# Patient Record
Sex: Female | Born: 1965 | Race: Black or African American | Hispanic: No
Health system: Midwestern US, Community
[De-identification: ages and names within clinical notes are randomized; demographics above are authoritative.]

## PROBLEM LIST (undated history)

## (undated) HISTORY — PX: BREAST SURGERY: SHX581

## (undated) HISTORY — PX: AUGMENTATION MAMMAPLASTY: SUR837

---

## 2001-08-29 ENCOUNTER — Encounter: Payer: Self-pay | Admitting: Family Medicine

## 2001-08-29 ENCOUNTER — Encounter: Admission: RE | Admit: 2001-08-29 | Discharge: 2001-08-29 | Payer: Self-pay | Admitting: Family Medicine

## 2001-09-20 ENCOUNTER — Emergency Department (HOSPITAL_COMMUNITY): Admission: EM | Admit: 2001-09-20 | Discharge: 2001-09-20 | Payer: Self-pay | Admitting: Emergency Medicine

## 2002-11-23 ENCOUNTER — Ambulatory Visit (HOSPITAL_COMMUNITY): Admission: RE | Admit: 2002-11-23 | Discharge: 2002-11-23 | Payer: Self-pay | Admitting: Otolaryngology

## 2003-04-05 ENCOUNTER — Encounter: Payer: Self-pay | Admitting: Family Medicine

## 2003-04-05 ENCOUNTER — Encounter: Admission: RE | Admit: 2003-04-05 | Discharge: 2003-04-05 | Payer: Self-pay | Admitting: Family Medicine

## 2006-10-07 ENCOUNTER — Ambulatory Visit: Payer: Self-pay | Admitting: Internal Medicine

## 2007-03-02 ENCOUNTER — Ambulatory Visit: Payer: Self-pay | Admitting: Internal Medicine

## 2007-07-08 ENCOUNTER — Ambulatory Visit (HOSPITAL_COMMUNITY): Admission: RE | Admit: 2007-07-08 | Discharge: 2007-07-08 | Payer: Self-pay | Admitting: Obstetrics

## 2007-08-04 ENCOUNTER — Encounter: Admission: RE | Admit: 2007-08-04 | Discharge: 2007-08-04 | Payer: Self-pay | Admitting: Obstetrics

## 2007-11-15 ENCOUNTER — Other Ambulatory Visit: Admission: RE | Admit: 2007-11-15 | Discharge: 2007-11-15 | Payer: Self-pay | Admitting: Diagnostic Radiology

## 2007-11-15 ENCOUNTER — Encounter: Admission: RE | Admit: 2007-11-15 | Discharge: 2007-11-15 | Payer: Self-pay | Admitting: Obstetrics

## 2007-11-15 ENCOUNTER — Encounter (INDEPENDENT_AMBULATORY_CARE_PROVIDER_SITE_OTHER): Payer: Self-pay | Admitting: Diagnostic Radiology

## 2008-01-31 ENCOUNTER — Telehealth (INDEPENDENT_AMBULATORY_CARE_PROVIDER_SITE_OTHER): Payer: Self-pay | Admitting: Family Medicine

## 2008-02-02 ENCOUNTER — Ambulatory Visit: Payer: Self-pay | Admitting: Internal Medicine

## 2008-02-02 DIAGNOSIS — J209 Acute bronchitis, unspecified: Secondary | ICD-10-CM

## 2008-02-02 DIAGNOSIS — J9801 Acute bronchospasm: Secondary | ICD-10-CM

## 2009-05-12 ENCOUNTER — Emergency Department (HOSPITAL_BASED_OUTPATIENT_CLINIC_OR_DEPARTMENT_OTHER): Admission: EM | Admit: 2009-05-12 | Discharge: 2009-05-12 | Payer: Self-pay | Admitting: Emergency Medicine

## 2010-03-27 ENCOUNTER — Encounter: Admission: RE | Admit: 2010-03-27 | Discharge: 2010-03-27 | Payer: Self-pay | Admitting: Obstetrics

## 2010-05-16 ENCOUNTER — Encounter: Admission: RE | Admit: 2010-05-16 | Discharge: 2010-05-16 | Payer: Self-pay | Admitting: Obstetrics

## 2012-06-27 ENCOUNTER — Emergency Department (HOSPITAL_BASED_OUTPATIENT_CLINIC_OR_DEPARTMENT_OTHER)
Admission: EM | Admit: 2012-06-27 | Discharge: 2012-06-27 | Disposition: A | Discharge status: Home / Self Care | Payer: No Typology Code available for payment source | Attending: Emergency Medicine | Admitting: Emergency Medicine

## 2012-06-27 ENCOUNTER — Encounter (HOSPITAL_BASED_OUTPATIENT_CLINIC_OR_DEPARTMENT_OTHER): Payer: Self-pay | Admitting: *Deleted

## 2012-06-27 DIAGNOSIS — S335XXA Sprain of ligaments of lumbar spine, initial encounter: Secondary | ICD-10-CM | POA: Insufficient documentation

## 2012-06-27 DIAGNOSIS — S39012A Strain of muscle, fascia and tendon of lower back, initial encounter: Secondary | ICD-10-CM

## 2012-06-27 DIAGNOSIS — Y9389 Activity, other specified: Secondary | ICD-10-CM | POA: Insufficient documentation

## 2012-06-27 DIAGNOSIS — Y92410 Unspecified street and highway as the place of occurrence of the external cause: Secondary | ICD-10-CM | POA: Insufficient documentation

## 2012-06-27 MED ORDER — IBUPROFEN 800 MG PO TABS
800.0000 mg | ORAL_TABLET | Freq: Once | ORAL | Status: AC
  Administered 2012-06-27: 800 mg via ORAL
  Filled 2012-06-27: qty 1

## 2012-06-27 MED ORDER — IBUPROFEN 800 MG PO TABS: 800.0000 mg | ORAL_TABLET | Freq: Three times a day (TID) | ORAL | Status: DC | PRN

## 2012-06-27 MED ORDER — CYCLOBENZAPRINE HCL 10 MG PO TABS: 10.0000 mg | ORAL_TABLET | Freq: Three times a day (TID) | ORAL | Status: DC | PRN
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
## 2013-01-01 ENCOUNTER — Emergency Department (HOSPITAL_BASED_OUTPATIENT_CLINIC_OR_DEPARTMENT_OTHER)
Admission: EM | Admit: 2013-01-01 | Discharge: 2013-01-01 | Disposition: A | Discharge status: Home / Self Care | Payer: Self-pay | Attending: Emergency Medicine | Admitting: Emergency Medicine

## 2013-01-01 ENCOUNTER — Encounter (HOSPITAL_BASED_OUTPATIENT_CLINIC_OR_DEPARTMENT_OTHER): Payer: Self-pay

## 2013-01-01 DIAGNOSIS — K529 Noninfective gastroenteritis and colitis, unspecified: Secondary | ICD-10-CM

## 2013-01-01 DIAGNOSIS — R197 Diarrhea, unspecified: Secondary | ICD-10-CM | POA: Insufficient documentation

## 2013-01-01 DIAGNOSIS — R42 Dizziness and giddiness: Secondary | ICD-10-CM | POA: Insufficient documentation

## 2013-01-01 DIAGNOSIS — R109 Unspecified abdominal pain: Secondary | ICD-10-CM | POA: Insufficient documentation

## 2013-01-01 DIAGNOSIS — Z3202 Encounter for pregnancy test, result negative: Secondary | ICD-10-CM | POA: Insufficient documentation

## 2013-01-01 DIAGNOSIS — K5289 Other specified noninfective gastroenteritis and colitis: Secondary | ICD-10-CM | POA: Insufficient documentation

## 2013-01-01 DIAGNOSIS — R61 Generalized hyperhidrosis: Secondary | ICD-10-CM | POA: Insufficient documentation

## 2013-01-01 LAB — CBC WITH DIFFERENTIAL/PLATELET
Basophils Relative: 0 % (ref 0–1)
Eosinophils Absolute: 0 10*3/uL (ref 0.0–0.7)
HCT: 40.4 % (ref 36.0–46.0)
Hemoglobin: 13.5 g/dL (ref 12.0–15.0)
MCH: 30.1 pg (ref 26.0–34.0)
MCHC: 33.4 g/dL (ref 30.0–36.0)
MCV: 90.2 fL (ref 78.0–100.0)
Monocytes Absolute: 0.4 10*3/uL (ref 0.1–1.0)
Monocytes Relative: 4 % (ref 3–12)

## 2013-01-01 LAB — COMPREHENSIVE METABOLIC PANEL
Albumin: 4 g/dL (ref 3.5–5.2)
BUN: 13 mg/dL (ref 6–23)
Creatinine, Ser: 0.9 mg/dL (ref 0.50–1.10)
GFR calc Af Amer: 88 mL/min — ABNORMAL LOW (ref >90)
Total Bilirubin: 0.5 mg/dL (ref 0.3–1.2)
Total Protein: 8 g/dL (ref 6.0–8.3)

## 2013-01-01 LAB — URINALYSIS, ROUTINE W REFLEX MICROSCOPIC
Bilirubin Urine: NEGATIVE
Glucose, UA: NEGATIVE mg/dL
Hgb urine dipstick: NEGATIVE
Ketones, ur: 15 mg/dL — AB
Protein, ur: NEGATIVE mg/dL

## 2013-01-01 LAB — LIPASE, BLOOD: Lipase: 20 U/L (ref 11–59)

## 2013-01-01 MED ORDER — MECLIZINE HCL 25 MG PO TABS: 25.0000 mg | ORAL_TABLET | Freq: Three times a day (TID) | ORAL | Status: DC | PRN

## 2013-01-01 MED ORDER — ONDANSETRON HCL 4 MG/2ML IJ SOLN
4.0000 mg | Freq: Once | INTRAMUSCULAR | Status: AC
  Administered 2013-01-01: 4 mg via INTRAVENOUS
  Filled 2013-01-01: qty 2

## 2013-01-01 MED ORDER — PROMETHAZINE HCL 25 MG PO TABS: 25.0000 mg | ORAL_TABLET | Freq: Four times a day (QID) | ORAL | Status: DC | PRN

## 2013-01-01 MED ORDER — DIPHENOXYLATE-ATROPINE 2.5-0.025 MG PO TABS: 1.0000 | ORAL_TABLET | Freq: Four times a day (QID) | ORAL | Status: DC | PRN

## 2013-01-01 MED ORDER — SODIUM CHLORIDE 0.9 % IV BOLUS (SEPSIS)
1000.0000 mL | Freq: Once | INTRAVENOUS | Status: AC
  Administered 2013-01-01: 1000 mL via INTRAVENOUS

## 2013-01-01 NOTE — ED Provider Notes (Signed)
History  This chart was scribed for Gilda Crease, MD by Ardelia Mems, ED Scribe. This patient was seen in room MH06/MH06 and the patient's care was started at 7:24 PM.   CSN: [id number]  Arrival date & time 01/01/13  7829     Chief Complaint  Patient presents with  . Emesis    The history is provided by the patient. No language interpreter was used.    HPI Comments: Ana Chavez is a 47 y.o. female who presents to the Emergency Department complaining of 3 nausea, vomiting and diarrhea onset this morning. Pt states that she has had 3 episodes of emesis since this morning and that she has been nauseous for most of the day. Pt states that the diarrhea onset later in the afternoon. Pt also reports associated dizziness and states that she feels like th room spins when is lying supine or bending over. Pt reports associated abdominal cramping and sweating. Pt denies sore throat, cough, congestion, fever, chills or any other symptoms.   History reviewed. No pertinent past medical history.  Past Surgical History  Procedure Laterality Date  . Breast surgery      No family history on file.  History  Substance Use Topics  . Smoking status: Never Smoker   . Smokeless tobacco: Not on file  . Alcohol Use: No    OB History   Grav Para Term Preterm Abortions TAB SAB Ect Mult Living                  Review of Systems  Constitutional: Negative for fever and chills.  HENT: Negative for congestion and sore throat.   Respiratory: Negative for cough.   Gastrointestinal: Positive for nausea, vomiting, abdominal pain and diarrhea.  Neurological: Positive for dizziness.   A complete 10 system review of systems was obtained and all systems are negative except as noted in the HPI and PMH.   Allergies  Review of patient's allergies indicates no known allergies.  Home Medications  No current outpatient prescriptions on file.  BP 142/95  Pulse 85  Temp(Src) 98.6 F (37 C)   Resp 18  SpO2 100%  Physical Exam  Constitutional: She is oriented to person, place, and time. She appears well-developed and well-nourished. No distress.  HENT:  Head: Normocephalic and atraumatic.  Right Ear: Hearing normal.  Left Ear: Hearing normal.  Nose: Nose normal.  Mouth/Throat: Oropharynx is clear and moist and mucous membranes are normal.  Eyes: Conjunctivae and EOM are normal. Pupils are equal, round, and reactive to light.  Neck: Normal range of motion. Neck supple.  Cardiovascular: Regular rhythm, S1 normal and S2 normal.  Exam reveals no gallop and no friction rub.   No murmur heard. Pulmonary/Chest: Effort normal and breath sounds normal. No respiratory distress. She exhibits no tenderness.  Abdominal: Soft. Normal appearance and bowel sounds are normal. There is no hepatosplenomegaly. There is no tenderness. There is no rebound, no guarding, no tenderness at McBurney's point and negative Murphy's sign. No hernia.  Musculoskeletal: Normal range of motion.  Neurological: She is alert and oriented to person, place, and time. She has normal strength. No cranial nerve deficit or sensory deficit. Coordination normal. GCS eye subscore is 4. GCS verbal subscore is 5. GCS motor subscore is 6.  Skin: Skin is warm, dry and intact. No rash noted. No cyanosis.  Psychiatric: She has a normal mood and affect. Her speech is normal and behavior is normal. Thought content normal.    ED  Course  Procedures (including critical care time)  DIAGNOSTIC STUDIES: Oxygen Saturation is 100% on RA, normal by my interpretation.    COORDINATION OF CARE: 7:28 PM- Pt advised of plan for treatment and pt agrees.  Medications  ondansetron (ZOFRAN) injection 4 mg (4 mg Intravenous Given 01/01/13 1957)  sodium chloride 0.9 % bolus 1,000 mL (1,000 mLs Intravenous New Bag/Given 01/01/13 1957)     Labs Reviewed  URINALYSIS, ROUTINE W REFLEX MICROSCOPIC - Abnormal; Notable for the following:    Ketones,  ur 15 (*)    All other components within normal limits  COMPREHENSIVE METABOLIC PANEL - Abnormal; Notable for the following:    AST 38 (*)    GFR calc non Af Amer 76 (*)    GFR calc Af Amer 88 (*)    All other components within normal limits  PREGNANCY, URINE  CBC WITH DIFFERENTIAL  LIPASE, BLOOD   No results found.   Diagnosis: Gastroenteritis; vertigo    MDM  Patient presents to ER with complaints of nausea, vomiting and diarrhea. Patient has had symptoms of a course of today. She is not expressing any abdominal pain and abdominal exam is benign and nontender. Lab work is entirely unremarkable. Patient was hydrated here and treated with Zofran. She'll be continued on some Vicodin for vomiting and diarrhea as an outpatient.  Patient did complain of vertigo sensation upon awakening this morning. She has had intermittent sensation of spinning with movement. There is no hypotension here in the ER. Symptoms may be secondary to the vomiting and diarrhea, but independently peripheral vertigo. No sign of central nervous system problem. Will treat symptomatically.   I personally performed the services described in this documentation, which was scribed in my presence. The recorded information has been reviewed and is accurate.     Gilda Crease, MD 01/01/13 2104

## 2013-01-01 NOTE — ED Notes (Signed)
Patient reports that she awoke this am with dizziness. Has vomited x 3 since this am and now reports diarrhea. Complains of abdominal cramping

## 2014-06-08 ENCOUNTER — Emergency Department (HOSPITAL_COMMUNITY)
Admission: EM | Admit: 2014-06-08 | Discharge: 2014-06-09 | Disposition: A | Discharge status: Home / Self Care | Payer: Medicaid Other | Attending: Emergency Medicine | Admitting: Emergency Medicine

## 2014-06-08 DIAGNOSIS — S6991XA Unspecified injury of right wrist, hand and finger(s), initial encounter: Secondary | ICD-10-CM | POA: Diagnosis present

## 2014-06-08 DIAGNOSIS — W208XXA Other cause of strike by thrown, projected or falling object, initial encounter: Secondary | ICD-10-CM | POA: Insufficient documentation

## 2014-06-08 DIAGNOSIS — Y9289 Other specified places as the place of occurrence of the external cause: Secondary | ICD-10-CM | POA: Insufficient documentation

## 2014-06-08 DIAGNOSIS — Y998 Other external cause status: Secondary | ICD-10-CM | POA: Insufficient documentation

## 2014-06-08 DIAGNOSIS — S6990XA Unspecified injury of unspecified wrist, hand and finger(s), initial encounter: Secondary | ICD-10-CM

## 2014-06-08 DIAGNOSIS — Y9389 Activity, other specified: Secondary | ICD-10-CM | POA: Insufficient documentation

## 2014-06-08 DIAGNOSIS — S6992XA Unspecified injury of left wrist, hand and finger(s), initial encounter: Secondary | ICD-10-CM | POA: Insufficient documentation

## 2014-06-09 ENCOUNTER — Encounter (HOSPITAL_COMMUNITY): Payer: Self-pay

## 2014-06-09 ENCOUNTER — Emergency Department (HOSPITAL_COMMUNITY): Payer: Medicaid Other

## 2014-06-09 MED ORDER — NAPROXEN 500 MG PO TABS: 500.0000 mg | ORAL_TABLET | Freq: Two times a day (BID) | ORAL | Status: DC

## 2014-06-09 NOTE — ED Provider Notes (Signed)
CSN: [id number]     Arrival date & time 06/08/14  2359 History   First MD Initiated Contact with Patient 06/09/14 0026     Chief Complaint  Patient presents with  . Wrist Injury  . Finger Injury     (Consider location/radiation/quality/duration/timing/severity/associated sxs/prior Treatment) Patient is a 48 y.o. female presenting with wrist injury. The history is provided by the patient and medical records.  Wrist Injury   This is a 48 year old female with no significant past medical history presenting to the ED for right wrist injury. Patient states she dropped a box full of folded up clothing on her wrist approximately one week ago but is still having pain. Pain localized to dorsal aspect of right wrist associated with mild swelling. States she has been unable to use her hand to pick up anything or to turn doorknobs.  Patient is right-hand dominant. Patient also complains of left middle finger pain during same accident. No new injuries, trauma, or falls. Patient denies any numbness or paresthesias of either hand or fingers. No prior hand injuries or surgeries.  History reviewed. No pertinent past medical history. Past Surgical History  Procedure Laterality Date  . Breast surgery     No family history on file. History  Substance Use Topics  . Smoking status: Never Smoker   . Smokeless tobacco: Not on file  . Alcohol Use: No   OB History    No data available     Review of Systems  Musculoskeletal: Positive for joint swelling and arthralgias.  All other systems reviewed and are negative.     Allergies  Review of patient's allergies indicates no known allergies.  Home Medications   Prior to Admission medications   Medication Sig Start Date End Date Taking? Authorizing Provider  ibuprofen (ADVIL,MOTRIN) 200 MG tablet Take 400 mg by mouth every 6 (six) hours as needed for moderate pain.   Yes Historical Provider, MD  diphenoxylate-atropine (LOMOTIL) 2.5-0.025 MG per tablet  Take 1 tablet by mouth 4 (four) times daily as needed for diarrhea or loose stools. 01/01/13   Gilda Creasehristopher J. Pollina, MD  meclizine (ANTIVERT) 25 MG tablet Take 1 tablet (25 mg total) by mouth 3 (three) times daily as needed for dizziness. 01/01/13   Gilda Creasehristopher J. Pollina, MD  promethazine (PHENERGAN) 25 MG tablet Take 1 tablet (25 mg total) by mouth every 6 (six) hours as needed for nausea. 01/01/13   Gilda Creasehristopher J. Pollina, MD   BP 157/95 mmHg  Pulse 90  Temp(Src) 97.4 F (36.3 C) (Oral)  Resp 16  SpO2 100%   Physical Exam  Constitutional: She is oriented to person, place, and time. She appears well-developed and well-nourished. No distress.  HENT:  Head: Normocephalic and atraumatic.  Mouth/Throat: Oropharynx is clear and moist.  Eyes: Conjunctivae and EOM are normal. Pupils are equal, round, and reactive to light.  Neck: Normal range of motion. Neck supple.  Cardiovascular: Normal rate, regular rhythm and normal heart sounds.   Pulmonary/Chest: Effort normal and breath sounds normal. No respiratory distress. She has no wheezes.  Musculoskeletal:       Right wrist: She exhibits tenderness, bony tenderness and swelling (mild). She exhibits normal range of motion, no effusion, no crepitus, no deformity and no laceration.       Left hand: She exhibits decreased range of motion, tenderness, bony tenderness and swelling (mild). She exhibits normal two-point discrimination, normal capillary refill, no deformity and no laceration. Normal sensation noted. Normal strength noted.  Hands: Right wrist with mild swelling and tenderness along dorsal aspect; no bony deformities; full ROM maintained, some pain noted with full flexion Left middle finger with mild swelling at PIP joint; no bony deformities; limited flexion due to pain; full extension Both hands remain NVI  Neurological: She is alert and oriented to person, place, and time.  Skin: Skin is warm and dry. She is not diaphoretic.    Psychiatric: She has a normal mood and affect.  Nursing note and vitals reviewed.   ED Course  Procedures (including critical care time) Labs Review Labs Reviewed - No data to display  Imaging Review Dg Wrist Complete Right  06/09/2014   CLINICAL DATA:  Dropped box on wrist 1 week ago with persistent pain and swelling.  EXAM: RIGHT WRIST - COMPLETE 3+ VIEW  COMPARISON:  None.  FINDINGS: There is no evidence of fracture or dislocation. There is no evidence of arthropathy or other focal bone abnormality. Soft tissues are unremarkable.  IMPRESSION: Negative.   Electronically Signed   By: Awilda Metroourtnay  Bloomer   On: 06/09/2014 01:09   Dg Finger Middle Left  06/09/2014   CLINICAL DATA:  Dropped box on hand 1 week ago, persistent pain and swelling.  EXAM: LEFT MIDDLE FINGER 2+V  COMPARISON:  None.  FINDINGS: There is no evidence of fracture or dislocation. There is no evidence of arthropathy or other focal bone abnormality. Soft tissues are unremarkable.  IMPRESSION: Negative.   Electronically Signed   By: Awilda Metroourtnay  Bloomer   On: 06/09/2014 01:11     EKG Interpretation None      MDM   Final diagnoses:  Hand injury   Imaging negative for acute fx.  Suspect bony contusion.  Recommend NSAIDs and supportive care.  Discussed plan with patient, he/she acknowledged understanding and agreed with plan of care.  Return precautions given for new or worsening symptoms.  Garlon HatchetLisa M Sanders, PA-C 06/09/14 [id number]  Shon Batonourtney F Horton, MD 06/09/14 [phone]

## 2014-06-09 NOTE — Discharge Instructions (Signed)
Take the prescribed medication as directed. °Follow-up with your primary care physician. °Return to the ED for new or worsening symptoms. ° °

## 2014-06-09 NOTE — ED Notes (Signed)
Pt presents with c/o right wrist injury. Pt reports that she dropped a box on her wrist approx one week ago and is still having pain and is unable to open anything with that hand. Also c/o left middle finger pain after the same incident.

## 2015-02-11 ENCOUNTER — Other Ambulatory Visit: Payer: Self-pay | Admitting: Obstetrics

## 2015-02-11 ENCOUNTER — Other Ambulatory Visit (HOSPITAL_COMMUNITY): Payer: Self-pay | Admitting: Obstetrics

## 2015-02-11 DIAGNOSIS — Z1231 Encounter for screening mammogram for malignant neoplasm of breast: Secondary | ICD-10-CM

## 2015-02-11 LAB — PROCEDURE REPORT - SCANNED: Pap: NEGATIVE

## 2015-02-18 ENCOUNTER — Ambulatory Visit
Admission: RE | Admit: 2015-02-18 | Discharge: 2015-02-18 | Disposition: A | Discharge status: Home / Self Care | Payer: Medicaid Other | Source: Ambulatory Visit | Attending: Obstetrics | Admitting: Obstetrics

## 2015-02-18 DIAGNOSIS — Z1231 Encounter for screening mammogram for malignant neoplasm of breast: Secondary | ICD-10-CM

## 2016-04-22 ENCOUNTER — Encounter: Payer: Self-pay | Admitting: *Deleted

## 2016-06-04 ENCOUNTER — Emergency Department (HOSPITAL_BASED_OUTPATIENT_CLINIC_OR_DEPARTMENT_OTHER)
Admission: EM | Admit: 2016-06-04 | Discharge: 2016-06-05 | Disposition: A | Discharge status: Home / Self Care | Payer: Self-pay | Attending: Emergency Medicine | Admitting: Emergency Medicine

## 2016-06-04 ENCOUNTER — Emergency Department (HOSPITAL_BASED_OUTPATIENT_CLINIC_OR_DEPARTMENT_OTHER): Payer: Self-pay

## 2016-06-04 ENCOUNTER — Encounter (HOSPITAL_BASED_OUTPATIENT_CLINIC_OR_DEPARTMENT_OTHER): Payer: Self-pay

## 2016-06-04 DIAGNOSIS — X500XXA Overexertion from strenuous movement or load, initial encounter: Secondary | ICD-10-CM | POA: Insufficient documentation

## 2016-06-04 DIAGNOSIS — Z791 Long term (current) use of non-steroidal anti-inflammatories (NSAID): Secondary | ICD-10-CM | POA: Insufficient documentation

## 2016-06-04 DIAGNOSIS — R0781 Pleurodynia: Secondary | ICD-10-CM | POA: Insufficient documentation

## 2016-06-04 DIAGNOSIS — Y998 Other external cause status: Secondary | ICD-10-CM | POA: Insufficient documentation

## 2016-06-04 DIAGNOSIS — Y929 Unspecified place or not applicable: Secondary | ICD-10-CM | POA: Insufficient documentation

## 2016-06-04 DIAGNOSIS — Y9372 Activity, wrestling: Secondary | ICD-10-CM | POA: Insufficient documentation

## 2016-06-04 DIAGNOSIS — Z79899 Other long term (current) drug therapy: Secondary | ICD-10-CM | POA: Insufficient documentation

## 2016-06-04 NOTE — ED Triage Notes (Signed)
Pt was wrestling with her son and felt something pop, now having intense pain under right breast that is worse with breathing and movement

## 2016-06-04 NOTE — ED Notes (Signed)
Patient transported to X-ray 

## 2016-06-04 NOTE — ED Provider Notes (Signed)
[facility] Note   CSN: [id number] Arrival date & time: 06/04/16  2320   By signing my name below, I, Nelwyn SalisburyJoshua Fowler, attest that this documentation has been prepared under the direction and in the presence of non-physician practitioner, Felicie Mornavid Nicko Daher, NP . Electronically Signed: Nelwyn SalisburyJoshua Fowler, Scribe. 06/04/2016. 11:43 PM.  History   Chief Complaint Chief Complaint  Patient presents with  . Rib Injury   The history is provided by the patient. No language interpreter was used.  Chest Pain   This is a new problem. The current episode started 1 to 2 hours ago. The problem occurs constantly. The problem has not changed since onset.The pain is associated with exertion, movement, raising an arm and breathing. Pain location: Right ribs. The pain is mild. The pain does not radiate. The symptoms are aggravated by certain positions and deep breathing. Pertinent negatives include no nausea and no vomiting. She has tried nothing for the symptoms. The treatment provided no relief.    HPI Comments:  Ana Chavez is an otherwise healthy 50 y.o. female who presents to the Emergency Department complaining of sudden-onset constant unchanged right sided rib pain beginning earlier today. Pt states that she was wrestling with her son when she felt a pop and is now having pain to her right ribs. She describes her pain as non-radiating and exacerbated by palpation and sitting. No alleviating factors indicated. She denies any nausea or vomiting.   History reviewed. No pertinent past medical history.  Patient Active Problem List   Diagnosis Date Noted  . ACUTE BRONCHITIS 02/02/2008  . ACUTE BRONCHOSPASM 02/02/2008    Past Surgical History:  Procedure Laterality Date  . BREAST SURGERY      OB History    No data available       Home Medications    Prior to Admission medications   Medication Sig Start Date End Date Taking? Authorizing Provider  diphenoxylate-atropine  (LOMOTIL) 2.5-0.025 MG per tablet Take 1 tablet by mouth 4 (four) times daily as needed for diarrhea or loose stools. 01/01/13   Gilda Creasehristopher J Pollina, MD  ibuprofen (ADVIL,MOTRIN) 200 MG tablet Take 400 mg by mouth every 6 (six) hours as needed for moderate pain.    Historical Provider, MD  meclizine (ANTIVERT) 25 MG tablet Take 1 tablet (25 mg total) by mouth 3 (three) times daily as needed for dizziness. 01/01/13   Gilda Creasehristopher J Pollina, MD  naproxen (NAPROSYN) 500 MG tablet Take 1 tablet (500 mg total) by mouth 2 (two) times daily with a meal. 06/09/14   Garlon HatchetLisa M Sanders, PA-C  promethazine (PHENERGAN) 25 MG tablet Take 1 tablet (25 mg total) by mouth every 6 (six) hours as needed for nausea. 01/01/13   Gilda Creasehristopher J Pollina, MD    Family History No family history on file.  Social History Social History  Substance Use Topics  . Smoking status: Never Smoker  . Smokeless tobacco: Not on file  . Alcohol use No     Allergies   Patient has no known allergies.   Review of Systems Review of Systems  Cardiovascular: Positive for chest pain (Chest Wall).  Gastrointestinal: Negative for nausea and vomiting.  Musculoskeletal: Positive for arthralgias.  All other systems reviewed and are negative.    Physical Exam Updated Vital Signs BP (!) 146/116 (BP Location: Left Arm)   Pulse (!) 123   Temp 97.8 F (36.6 C) (Oral)   Resp 24   Ht 5\' 9"  (1.753 m)   Wt  195 lb (88.5 kg)   SpO2 97%   BMI 28.80 kg/m   Physical Exam  Constitutional: She is oriented to person, place, and time. She appears well-developed and well-nourished. No distress.  HENT:  Head: Normocephalic and atraumatic.  Eyes: Conjunctivae are normal.  Cardiovascular: Normal rate.   Pulmonary/Chest: Effort normal and breath sounds normal.  Abdominal: She exhibits no distension.  Musculoskeletal:  Right lateral midline chest wall tenderness underneath right breast. No crepitus.  Neurological: She is alert and oriented to  person, place, and time.  Skin: Skin is warm and dry.  Psychiatric: She has a normal mood and affect.  Nursing note and vitals reviewed.    ED Treatments / Results  DIAGNOSTIC STUDIES:  Oxygen Saturation is 97% on RA, normal by my interpretation.    COORDINATION OF CARE:  11:46 PM Discussed treatment plan with pt at bedside which includes imaging and pt agreed to plan.  Labs (all labs ordered are listed, but only abnormal results are displayed) Labs Reviewed - No data to display  EKG  EKG Interpretation None       Radiology No results found.  Procedures Procedures (including critical care time)  Medications Ordered in ED Medications - No data to display   Initial Impression / Assessment and Plan / ED Course  I have reviewed the triage vital signs and the nursing notes.  Pertinent labs & imaging results that were available during my care of the patient were reviewed by me and considered in my medical decision making (see chart for details).  Clinical Course    No rib fractures identified on chest film. Care instructions provided. Return precautions discussed.    Final Clinical Impressions(s) / ED Diagnoses   Final diagnoses:  Rib pain on right side    New Prescriptions Discharge Medication List as of 06/05/2016  1:05 AM    START taking these medications   Details  HYDROcodone-acetaminophen (NORCO) 5-325 MG tablet Take 2 tablets by mouth every 4 (four) hours as needed., Starting Fri 06/05/2016, Print       I personally performed the services described in this documentation, which was scribed in my presence. The recorded information has been reviewed and is accurate.    Felicie Mornavid Trixie Maclaren, NP 06/05/16 [id number]    Paula LibraJohn Molpus, MD 06/05/16 [phone]

## 2016-06-05 MED ORDER — HYDROCODONE-ACETAMINOPHEN 5-325 MG PO TABS: 2.0000 | ORAL_TABLET | ORAL | 0 refills | Status: DC | PRN

## 2016-06-05 MED ORDER — HYDROCODONE-ACETAMINOPHEN 5-325 MG PO TABS: 1.0000 | ORAL_TABLET | Freq: Four times a day (QID) | ORAL | 0 refills | Status: DC | PRN

## 2016-06-05 MED ORDER — NAPROXEN 500 MG PO TABS: 500.0000 mg | ORAL_TABLET | Freq: Two times a day (BID) | ORAL | 0 refills | Status: DC

## 2017-08-01 ENCOUNTER — Other Ambulatory Visit: Payer: Self-pay

## 2017-08-01 ENCOUNTER — Emergency Department (HOSPITAL_BASED_OUTPATIENT_CLINIC_OR_DEPARTMENT_OTHER)
Admission: EM | Admit: 2017-08-01 | Discharge: 2017-08-01 | Disposition: A | Discharge status: Home / Self Care | Payer: Self-pay | Attending: Emergency Medicine | Admitting: Emergency Medicine

## 2017-08-01 ENCOUNTER — Encounter (HOSPITAL_BASED_OUTPATIENT_CLINIC_OR_DEPARTMENT_OTHER): Payer: Self-pay | Admitting: Adult Health

## 2017-08-01 ENCOUNTER — Emergency Department (HOSPITAL_BASED_OUTPATIENT_CLINIC_OR_DEPARTMENT_OTHER): Payer: Self-pay

## 2017-08-01 DIAGNOSIS — Y929 Unspecified place or not applicable: Secondary | ICD-10-CM | POA: Insufficient documentation

## 2017-08-01 DIAGNOSIS — M25571 Pain in right ankle and joints of right foot: Secondary | ICD-10-CM | POA: Insufficient documentation

## 2017-08-01 DIAGNOSIS — Y9389 Activity, other specified: Secondary | ICD-10-CM | POA: Insufficient documentation

## 2017-08-01 DIAGNOSIS — Y998 Other external cause status: Secondary | ICD-10-CM | POA: Insufficient documentation

## 2017-08-01 DIAGNOSIS — W541XXA Struck by dog, initial encounter: Secondary | ICD-10-CM | POA: Insufficient documentation

## 2017-08-01 MED ORDER — ACETAMINOPHEN 500 MG PO TABS: 500.0000 mg | ORAL_TABLET | Freq: Four times a day (QID) | ORAL | 0 refills | Status: DC | PRN

## 2017-08-01 MED ORDER — IBUPROFEN 600 MG PO TABS: 600.0000 mg | ORAL_TABLET | Freq: Four times a day (QID) | ORAL | 0 refills | Status: DC | PRN

## 2017-08-01 NOTE — ED Notes (Signed)
Pt given d/c instructions as per chart. Rx x 2. Verbalizes understanding. No questions. 

## 2017-08-01 NOTE — ED Notes (Signed)
Pt c/o pain, swelling and bruising to right ankle after tripping over dog. Moves toes. Feels touch. Cap refill < 3 sec.

## 2017-08-01 NOTE — ED Triage Notes (Signed)
PResents with right medial aspect of ankle swelling and pain, occurred one week ago when she tripped over her dog and ankle was hit. She reports burning pain. SHe has been icing ankle with no relief. Bruising noted. Nothing makes pain better. Boot and walking make pain worse.

## 2017-08-01 NOTE — ED Provider Notes (Signed)
[facility] EMERGENCY DEPARTMENT Provider Note   CSN: [id number] Arrival date & time: 08/01/17  1935     History   Chief Complaint Chief Complaint  Patient presents with  . Ankle Pain    HPI Ana Chavez is a 51 y.o. female who presents with right ankle pain for the past week.  She reports her pit bull ran into her right ankle.  She is unsure if she had any twisting or turning injury because it happened so quickly.  She has been trying ice and elevation over the past week without relief.  She has been ambulating, but with pain.  She is not taking any medications at home for symptoms.  She denies any numbness or tingling.  HPI  History reviewed. No pertinent past medical history.  Patient Active Problem List   Diagnosis Date Noted  . ACUTE BRONCHITIS 02/02/2008  . ACUTE BRONCHOSPASM 02/02/2008    Past Surgical History:  Procedure Laterality Date  . BREAST SURGERY      OB History    No data available       Home Medications    Prior to Admission medications   Medication Sig Start Date End Date Taking? Authorizing Provider  acetaminophen (TYLENOL) 500 MG tablet Take 1 tablet (500 mg total) by mouth every 6 (six) hours as needed. 08/01/17   Delia Sitar, Waylan Boga, PA-C  diphenoxylate-atropine (LOMOTIL) 2.5-0.025 MG per tablet Take 1 tablet by mouth 4 (four) times daily as needed for diarrhea or loose stools. 01/01/13   Gilda Crease, MD  HYDROcodone-acetaminophen (NORCO) 5-325 MG tablet Take 1 tablet by mouth every 6 (six) hours as needed for severe pain. 06/05/16   Felicie Morn, NP  ibuprofen (ADVIL,MOTRIN) 600 MG tablet Take 1 tablet (600 mg total) by mouth every 6 (six) hours as needed. 08/01/17   Raymundo Rout, Waylan Boga, PA-C  meclizine (ANTIVERT) 25 MG tablet Take 1 tablet (25 mg total) by mouth 3 (three) times daily as needed for dizziness. 01/01/13   Gilda Crease, MD  naproxen (NAPROSYN) 500 MG tablet Take 1 tablet (500 mg total) by mouth 2 (two) times  daily. 06/05/16   Felicie Morn, NP  promethazine (PHENERGAN) 25 MG tablet Take 1 tablet (25 mg total) by mouth every 6 (six) hours as needed for nausea. 01/01/13   Gilda Crease, MD    Family History History reviewed. No pertinent family history.  Social History Social History   Tobacco Use  . Smoking status: Never Smoker  Substance Use Topics  . Alcohol use: No  . Drug use: No     Allergies   Patient has no known allergies.   Review of Systems Review of Systems  Musculoskeletal: Positive for arthralgias (R ankle).  Skin: Positive for color change (ecchymosis).  Neurological: Negative for numbness.     Physical Exam Updated Vital Signs BP (!) 141/94 (BP Location: Left Arm)   Pulse 89   Temp 97.7 F (36.5 C) (Oral)   Resp 18   Ht 5\' 8"  (1.727 m)   Wt 86.6 kg (191 lb)   SpO2 100%   BMI 29.04 kg/m   Physical Exam  Constitutional: She appears well-developed and well-nourished. No distress.  HENT:  Head: Normocephalic and atraumatic.  Mouth/Throat: Oropharynx is clear and moist. No oropharyngeal exudate.  Eyes: Conjunctivae are normal. Pupils are equal, round, and reactive to light. Right eye exhibits no discharge. Left eye exhibits no discharge. No scleral icterus.  Neck: Normal range of motion.  Cardiovascular: Normal rate, regular rhythm, normal heart sounds and intact distal pulses. Exam reveals no gallop and no friction rub.  No murmur heard. Pulmonary/Chest: Effort normal and breath sounds normal. No stridor. No respiratory distress. She has no wheezes. She has no rales.  Musculoskeletal: She exhibits no edema.       Feet:  Full range of motion at the ankle joint, DP pulses intact, normal sensation  Neurological: She is alert. Coordination normal.  Skin: Skin is warm and dry. No rash noted. She is not diaphoretic. No pallor.  Psychiatric: She has a normal mood and affect.  Nursing note and vitals reviewed.    ED Treatments / Results  Labs (all  labs ordered are listed, but only abnormal results are displayed) Labs Reviewed - No data to display  EKG  EKG Interpretation None       Radiology Dg Ankle Complete Right  Result Date: 08/01/2017 CLINICAL DATA:  States her pit bull ran into her Rt ankle 1 wk ago and has continued pain/bruising/swelling around medial malleolus EXAM: RIGHT ANKLE - COMPLETE 3+ VIEW COMPARISON:  None. FINDINGS: There is significant soft tissue swelling along the medial aspect of the ankle, not associated with fracture or dislocation. No radiopaque foreign body or soft tissue gas. IMPRESSION: Significant soft tissue swelling without acute osseous injury. Electronically Signed   By: Norva PavlovElizabeth  Brown M.D.   On: 08/01/2017 21:04    Procedures Procedures (including critical care time)  Medications Ordered in ED Medications - No data to display   Initial Impression / Assessment and Plan / ED Course  I have reviewed the triage vital signs and the nursing notes.  Pertinent labs & imaging results that were available during my care of the patient were reviewed by me and considered in my medical decision making (see chart for details).     Patient X-Ray negative for obvious fracture or dislocation.  Significant ecchymosis, most likely from impact. Home Care: Rest and elevate the injured ankle, apply ice intermittently. Use crutches without weight bearing until able to comfortable bear partial weight, then progress to full weight bearing as tolerated. ASO splint applied. See ortho prn.  Patient will be dc home & is agreeable with above plan.   Final Clinical Impressions(s) / ED Diagnoses   Final diagnoses:  Acute right ankle pain    ED Discharge Orders        Ordered    ibuprofen (ADVIL,MOTRIN) 600 MG tablet  Every 6 hours PRN     08/01/17 2319    acetaminophen (TYLENOL) 500 MG tablet  Every 6 hours PRN     08/01/17 2319       Emi HolesLaw, Ellarie Picking M, PA-C 08/01/17 2320    Tilden Fossaees, Elizabeth, MD 08/02/17  [phone]

## 2017-08-01 NOTE — ED Notes (Signed)
ED Provider at bedside. 

## 2017-08-01 NOTE — Discharge Instructions (Signed)
Medications: Ibuprofen, Tylenol ° °Treatment: Take ibuprofen every 6 hours to help with pain and swelling. You can alternate with Tylenol for breakthrough pain 4 hours after taking ibuprofen. Use ice 3-4 times daily alternating 20 minutes on, 20 minutes off. Keep your leg elevated whenever you're not walking on it. Wear your splint at all times except when bathing. Use crutches at all times initially and begin partial weightbearing as tolerated. ° °Follow-up: Please follow-up with the orthopedic doctor if your symptoms are not improving over the next 7-10 days. Please return to the emergency department if you develop any new or worsening symptoms. ° °

## 2018-05-31 ENCOUNTER — Other Ambulatory Visit: Payer: Self-pay | Admitting: Obstetrics and Gynecology

## 2018-05-31 ENCOUNTER — Other Ambulatory Visit (HOSPITAL_COMMUNITY)
Admission: RE | Admit: 2018-05-31 | Discharge: 2018-05-31 | Disposition: A | Discharge status: Home / Self Care | Payer: PRIVATE HEALTH INSURANCE | Source: Ambulatory Visit | Attending: Obstetrics and Gynecology | Admitting: Obstetrics and Gynecology

## 2018-05-31 DIAGNOSIS — Z01411 Encounter for gynecological examination (general) (routine) with abnormal findings: Secondary | ICD-10-CM | POA: Diagnosis not present

## 2018-06-03 LAB — CYTOLOGY - PAP
Diagnosis: NEGATIVE
HPV 16/18/45 genotyping: NEGATIVE
HPV: DETECTED — AB

## 2018-07-26 ENCOUNTER — Other Ambulatory Visit: Payer: Self-pay | Admitting: Physician Assistant

## 2018-07-26 DIAGNOSIS — Z1231 Encounter for screening mammogram for malignant neoplasm of breast: Secondary | ICD-10-CM

## 2018-08-29 ENCOUNTER — Ambulatory Visit: Payer: PRIVATE HEALTH INSURANCE

## 2018-09-05 ENCOUNTER — Ambulatory Visit
Admission: RE | Admit: 2018-09-05 | Discharge: 2018-09-05 | Disposition: A | Discharge status: Home / Self Care | Payer: PRIVATE HEALTH INSURANCE | Source: Ambulatory Visit | Attending: Physician Assistant | Admitting: Physician Assistant

## 2018-09-05 DIAGNOSIS — Z1231 Encounter for screening mammogram for malignant neoplasm of breast: Secondary | ICD-10-CM

## 2019-05-18 ENCOUNTER — Encounter: Payer: Self-pay | Admitting: Podiatry

## 2019-05-18 ENCOUNTER — Other Ambulatory Visit: Payer: Self-pay

## 2019-05-18 ENCOUNTER — Ambulatory Visit (INDEPENDENT_AMBULATORY_CARE_PROVIDER_SITE_OTHER): Payer: PRIVATE HEALTH INSURANCE | Admitting: Podiatry

## 2019-05-18 VITALS — BP 152/101 | HR 94 | Resp 16

## 2019-05-18 DIAGNOSIS — L600 Ingrowing nail: Secondary | ICD-10-CM | POA: Diagnosis not present

## 2019-05-18 NOTE — Patient Instructions (Signed)

## 2019-05-18 NOTE — Progress Notes (Signed)
   Subjective:    Patient ID: Ana Chavez, female    DOB: 1965-12-01, 53 y.o.   MRN: [id number]  HPI    Review of Systems  All other systems reviewed and are negative.      Objective:   Physical Exam        Assessment & Plan:

## 2019-05-21 NOTE — Progress Notes (Signed)
Subjective:   Patient ID: Ana Chavez, female   DOB: 53 y.o.   MRN: [id number]   HPI Patient presents complaining of chronic ingrown toenail of the right big toe and states that it is gotten better recently but soaking has helped but she still concerned about the nature of the condition and what can be done.  Patient does not smoke likes to be active   Review of Systems  All other systems reviewed and are negative.       Objective:  Physical Exam Vitals signs and nursing note reviewed.  Constitutional:      Appearance: She is well-developed.  Pulmonary:     Effort: Pulmonary effort is normal.  Musculoskeletal: Normal range of motion.  Skin:    General: Skin is warm.  Neurological:     Mental Status: She is alert.     Neurovascular status intact muscle strength found to be adequate range of motion within normal limits with patient found to have incurvated right hallux that is localized with no drainage no redness and only mild discomfort currently     Assessment:  History of growing toenail deformity right that seems to be doing okay currently but does have history     Plan:  H&P and educated on condition and considerations for treatment.  Since it is doing better we will just go ahead take a wait-and-see attitude but I do think ultimately something will need to be done with this and I educated her on ingrown toenail removal and permanent type nature of condition.  Patient does want to do this but cannot do this now will be seen back as it works for her schedule

## 2020-08-05 ENCOUNTER — Ambulatory Visit
Admit: 2020-08-05 | Payer: PRIVATE HEALTH INSURANCE | Primary: Student in an Organized Health Care Education/Training Program

## 2020-08-05 ENCOUNTER — Ambulatory Visit

## 2020-08-05 DIAGNOSIS — Z20822 Contact with and (suspected) exposure to covid-19: Secondary | ICD-10-CM

## 2020-08-05 NOTE — Progress Notes (Signed)
This patient was seen at [facility] while in their vehicle due to COVID-19 pandemic with PPE and focused examination in order to decrease community viral transmission.     The patient/guardian gave verbal consent to treat.    The history is provided by the patient.   Cough  The history is provided by the patient. This is a new problem. The current episode started more than 2 days ago. The problem occurs constantly. The problem has not changed since onset.The cough is non-productive. There has been no fever. Associated symptoms include headaches, rhinorrhea (and sinus congestion) and sore throat. Pertinent negatives include no wheezing. She has tried nothing for the symptoms. She is not a smoker. Her past medical history does not include asthma.        Past Medical History:   Diagnosis Date   ??? HLD (hyperlipidemia)    ??? Hypertension         History reviewed. No pertinent surgical history.      History reviewed. No pertinent family history.     Social History     Socioeconomic History   ??? Marital status: UNKNOWN     Spouse name: Not on file   ??? Number of children: Not on file   ??? Years of education: Not on file   ??? Highest education level: Not on file   Occupational History   ??? Not on file   Tobacco Use   ??? Smoking status: Never Smoker   ??? Smokeless tobacco: Never Used   Substance and Sexual Activity   ??? Alcohol use: Not on file   ??? Drug use: Not on file   ??? Sexual activity: Not on file   Other Topics Concern   ??? Not on file   Social History Narrative   ??? Not on file     Social Determinants of Health     Financial Resource Strain:    ??? Difficulty of Paying Living Expenses: Not on file   Food Insecurity:    ??? Worried About Running Out of Food in the Last Year: Not on file   ??? Ran Out of Food in the Last Year: Not on file   Transportation Needs:    ??? Lack of Transportation (Medical): Not on file   ??? Lack of Transportation (Non-Medical): Not on file   Physical Activity:    ??? Days of Exercise per  Week: Not on file   ??? Minutes of Exercise per Session: Not on file   Stress:    ??? Feeling of Stress : Not on file   Social Connections:    ??? Frequency of Communication with Friends and Family: Not on file   ??? Frequency of Social Gatherings with Friends and Family: Not on file   ??? Attends Religious Services: Not on file   ??? Active Member of Clubs or Organizations: Not on file   ??? Attends Banker Meetings: Not on file   ??? Marital Status: Not on file   Intimate Partner Violence:    ??? Fear of Current or Ex-Partner: Not on file   ??? Emotionally Abused: Not on file   ??? Physically Abused: Not on file   ??? Sexually Abused: Not on file   Housing Stability:    ??? Unable to Pay for Housing in the Last Year: Not on file   ??? Number of Places Lived in the Last Year: Not on file   ??? Unstable Housing in the Last Year: Not  on file                ALLERGIES: Patient has no known allergies.    Review of Systems   HENT: Positive for congestion, rhinorrhea (and sinus congestion) and sore throat.    Respiratory: Positive for cough. Negative for chest tightness and wheezing.    Neurological: Positive for headaches.   All other systems reviewed and are negative.      Vitals:    08/05/20 1111   Pulse: 85   Resp: 18   Temp: 98.7 ??F (37.1 ??C)   SpO2: 98%       Physical Exam  Vitals and nursing note reviewed.   Constitutional:       General: She is not in acute distress.     Appearance: She is not ill-appearing.   Pulmonary:      Effort: Pulmonary effort is normal. No respiratory distress.      Breath sounds: No wheezing.   Genitourinary:     Rectum: Normal.         MDM    Procedures      ICD-10-CM ICD-9-CM    1. Suspected COVID-19 virus infection  Z20.822 V01.79 NOVEL CORONAVIRUS (COVID-19)     No orders of the defined types were placed in this encounter.    No results found for any visits on 08/05/20.  The patients condition was discussed with the patient and they understand.  The patient is to follow up with primary care doctor.   If signs and symptoms become worse the pt is to go to the ER. The patient is to take medications as prescribed.

## 2020-08-07 LAB — NOVEL CORONAVIRUS (COVID-19): SARS-CoV-2, NAA: NOT DETECTED

## 2020-08-07 LAB — SARS-COV-2, NAA 2 DAY TAT

## 2020-08-07 LAB — COVID-19: SARS-CoV-2, NAA: NOT DETECTED

## 2020-10-28 IMAGING — MG DIGITAL SCREENING BILATERAL MAMMOGRAM WITH IMPLANTS AND CAD
9 series · 9 of 9 positions shown · non-contrast
Comparison: Previous exam(s).

CLINICAL DATA: Screening.

EXAM:
DIGITAL SCREENING BILATERAL MAMMOGRAM WITH IMPLANTS AND CAD
The patient has retropectoral implants. Standard and implant
displaced views were performed.

[L MLO (1 of 3)]
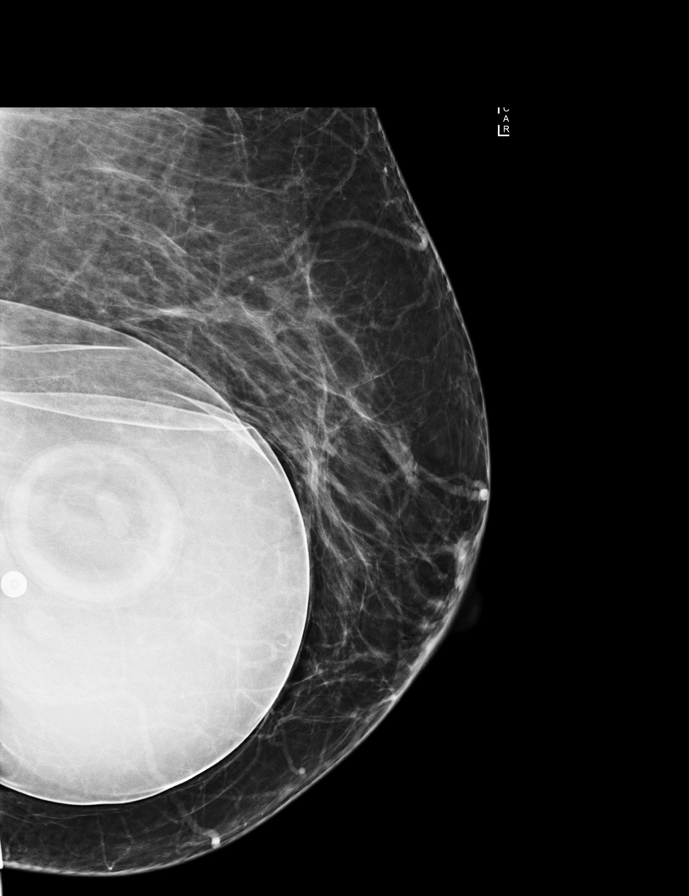

[R MLO (1 of 2)]
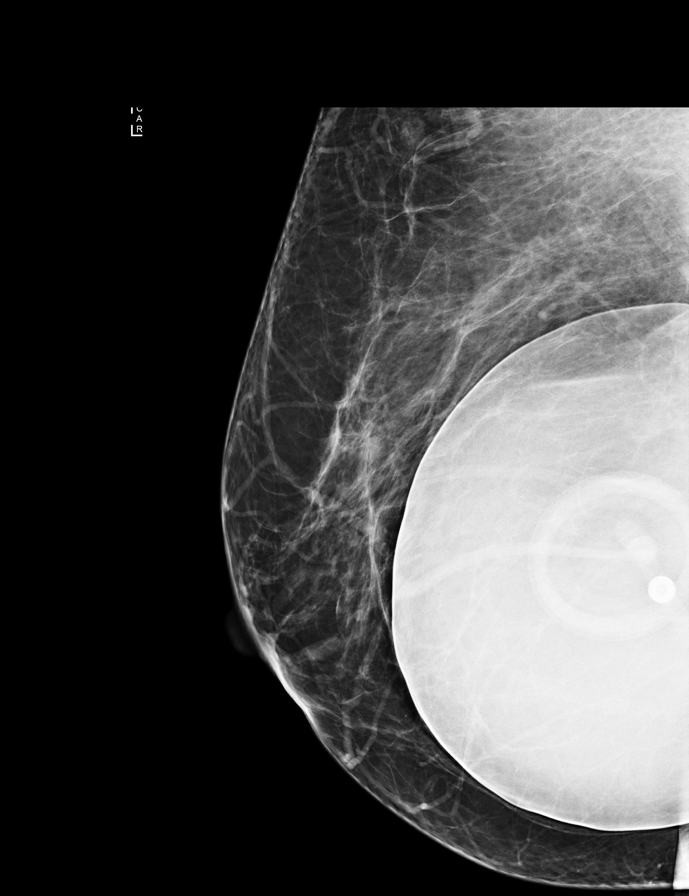

[L MLO (2 of 3)]
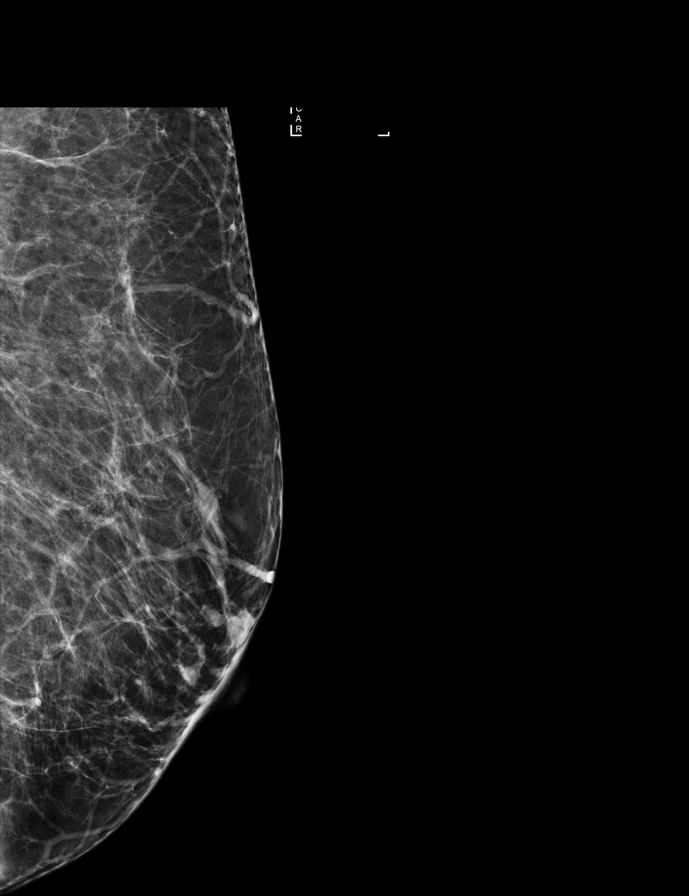

[L MLO (3 of 3)]
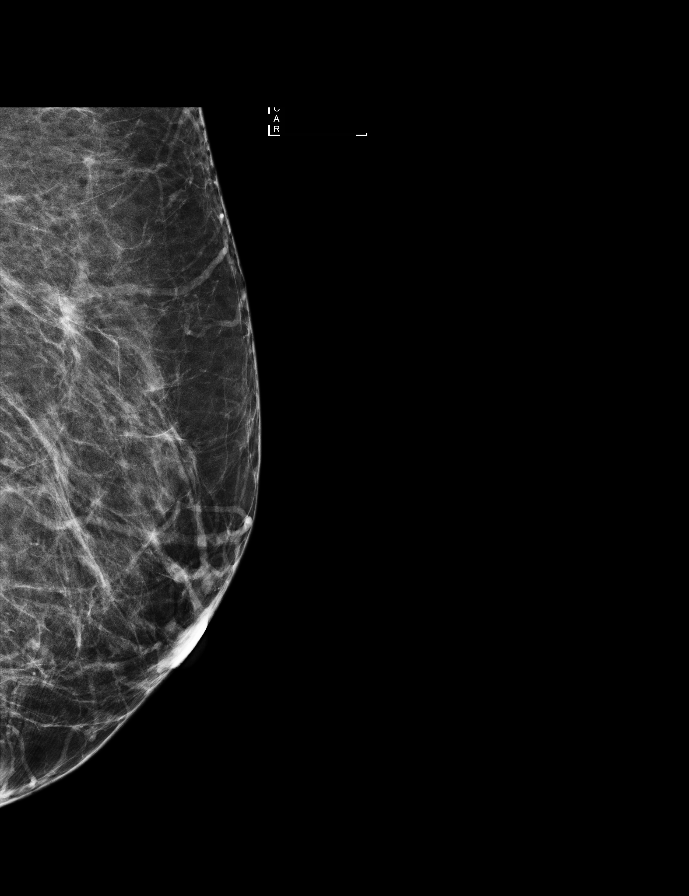

[L CC (1 of 2)]
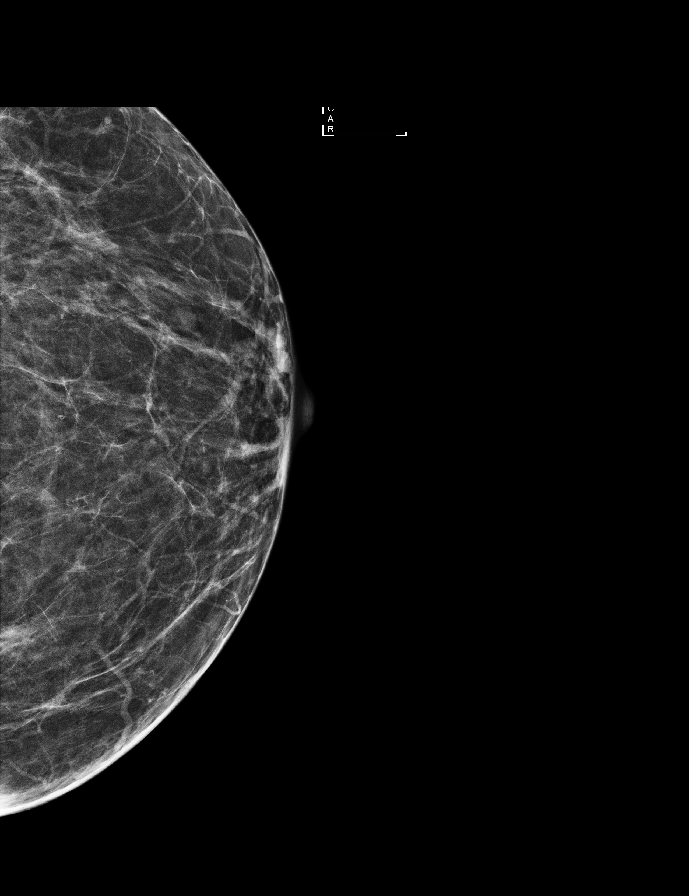

[R CC (1 of 2)]
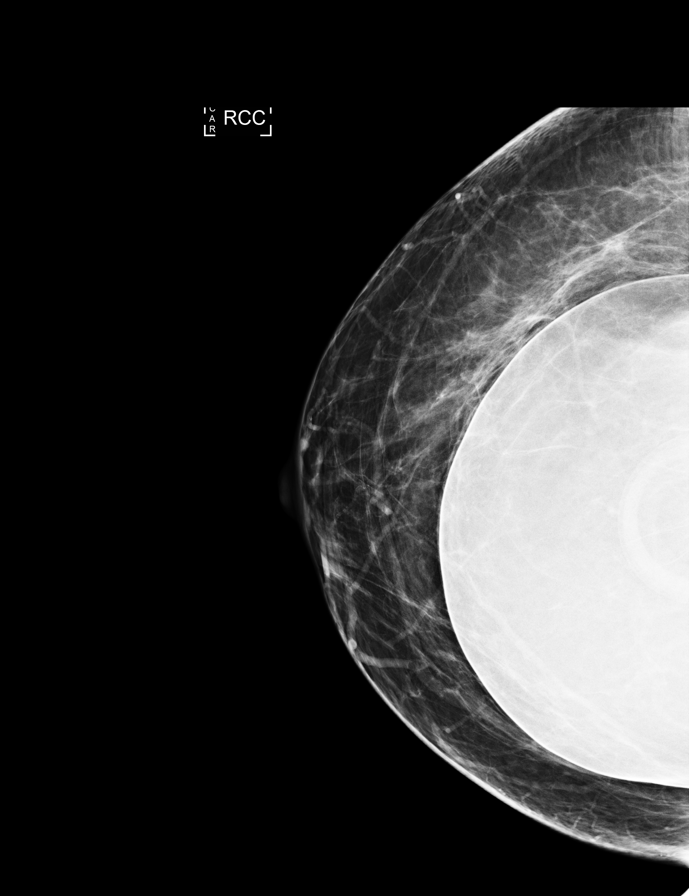

[L CC (2 of 2)]
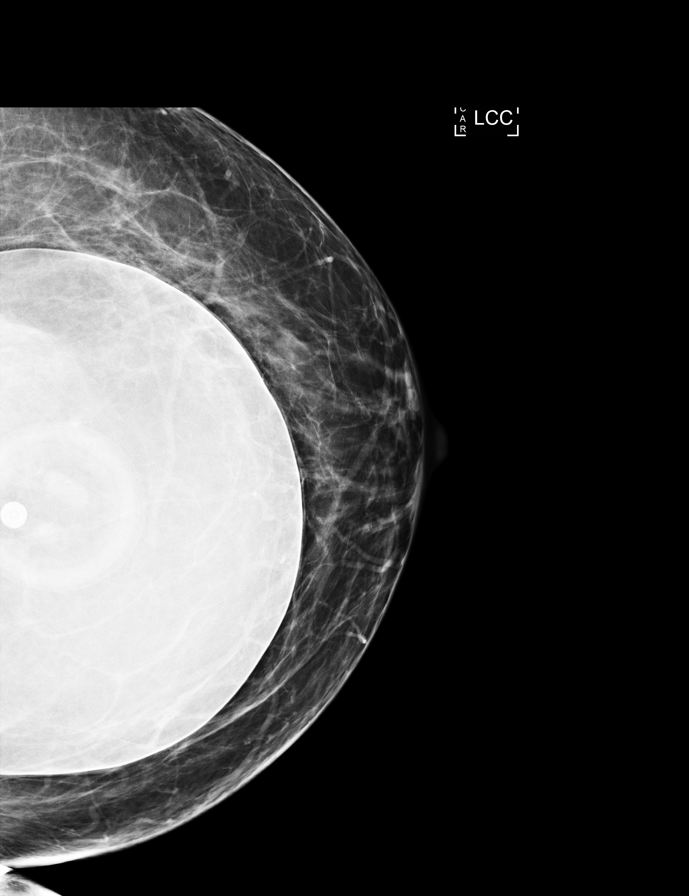

[R CC (2 of 2)]
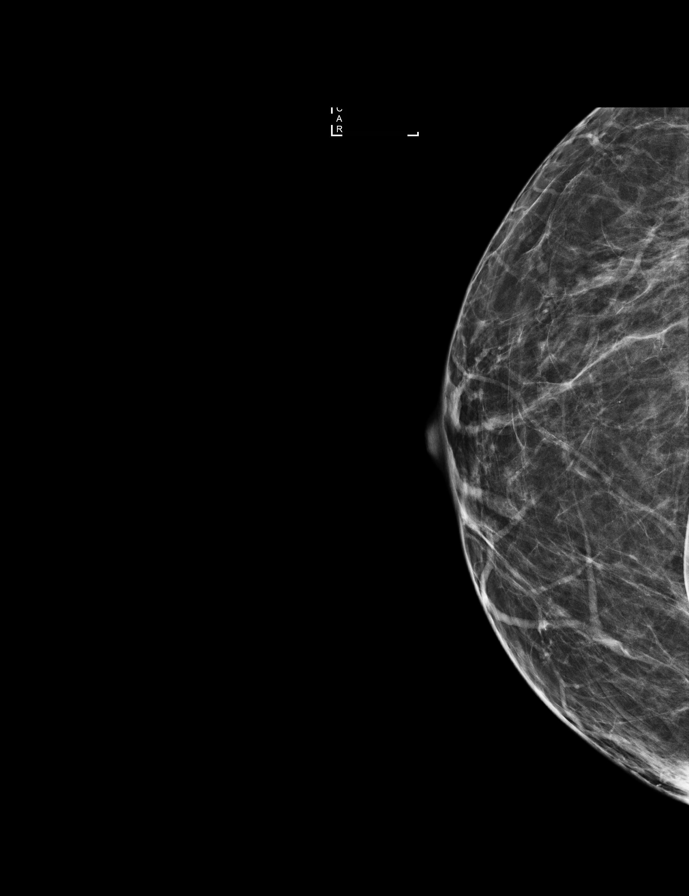

[R MLO (2 of 2)]
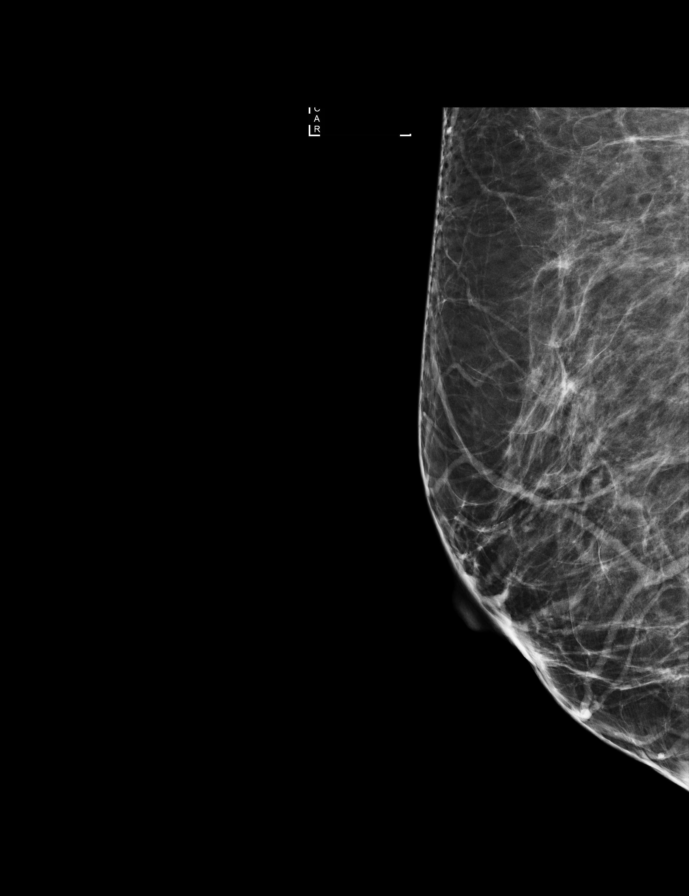

[9 of 9 positions shown; findings below may reference images not displayed]

ACR Breast Density Category b: There are scattered areas of
fibroglandular density.
FINDINGS: There are no findings suspicious for malignancy. Images were
processed with CAD.
IMPRESSION: No mammographic evidence of malignancy. A result letter of this
screening mammogram will be mailed directly to the patient.

RECOMMENDATION:
Screening mammogram in one year. (Code:KD-9-HRT)

BI-RADS CATEGORY  1:  Negative.
